# Patient Record
Sex: Male | Born: 1965 | Race: Black or African American | Hispanic: No | Marital: Married | Smoking: Former smoker
Health system: Southern US, Community
[De-identification: ages and names within clinical notes are randomized; demographics above are authoritative.]

## PROBLEM LIST (undated history)

## (undated) DIAGNOSIS — J449 Chronic obstructive pulmonary disease, unspecified: Secondary | ICD-10-CM

## (undated) DIAGNOSIS — G4730 Sleep apnea, unspecified: Secondary | ICD-10-CM

## (undated) DIAGNOSIS — Z8639 Personal history of other endocrine, nutritional and metabolic disease: Secondary | ICD-10-CM

## (undated) DIAGNOSIS — I10 Essential (primary) hypertension: Secondary | ICD-10-CM

## (undated) DIAGNOSIS — I509 Heart failure, unspecified: Secondary | ICD-10-CM

## (undated) DIAGNOSIS — E119 Type 2 diabetes mellitus without complications: Secondary | ICD-10-CM

## (undated) HISTORY — PX: OTHER SURGICAL HISTORY: SHX169

## (undated) HISTORY — PX: ANGIOPLASTY: SHX39

## (undated) HISTORY — PX: NASAL SINUS SURGERY: SHX719

---

## 2008-06-24 ENCOUNTER — Emergency Department: Payer: Self-pay | Admitting: Emergency Medicine

## 2009-07-23 ENCOUNTER — Encounter: Admission: RE | Admit: 2009-07-23 | Discharge: 2009-07-23 | Payer: Self-pay | Admitting: Cardiovascular Disease

## 2009-07-24 ENCOUNTER — Ambulatory Visit (HOSPITAL_COMMUNITY): Admission: RE | Admit: 2009-07-24 | Discharge: 2009-07-24 | Payer: Self-pay | Admitting: Cardiovascular Disease

## 2009-11-01 ENCOUNTER — Encounter: Admission: RE | Admit: 2009-11-01 | Discharge: 2009-11-01 | Payer: Self-pay | Admitting: Internal Medicine

## 2011-02-19 LAB — GLUCOSE, CAPILLARY: Glucose-Capillary: 132 mg/dL — ABNORMAL HIGH (ref 70–99)

## 2021-10-18 ENCOUNTER — Emergency Department (HOSPITAL_BASED_OUTPATIENT_CLINIC_OR_DEPARTMENT_OTHER): Payer: No Typology Code available for payment source

## 2021-10-18 ENCOUNTER — Other Ambulatory Visit: Payer: Self-pay

## 2021-10-18 ENCOUNTER — Encounter (HOSPITAL_BASED_OUTPATIENT_CLINIC_OR_DEPARTMENT_OTHER): Payer: Self-pay | Admitting: Emergency Medicine

## 2021-10-18 ENCOUNTER — Emergency Department (HOSPITAL_BASED_OUTPATIENT_CLINIC_OR_DEPARTMENT_OTHER)
Admission: EM | Admit: 2021-10-18 | Discharge: 2021-10-19 | Disposition: A | Discharge status: Home / Self Care | Payer: No Typology Code available for payment source | Attending: Emergency Medicine | Admitting: Emergency Medicine

## 2021-10-18 DIAGNOSIS — I509 Heart failure, unspecified: Secondary | ICD-10-CM | POA: Diagnosis not present

## 2021-10-18 DIAGNOSIS — E119 Type 2 diabetes mellitus without complications: Secondary | ICD-10-CM | POA: Insufficient documentation

## 2021-10-18 DIAGNOSIS — I110 Hypertensive heart disease with heart failure: Secondary | ICD-10-CM | POA: Diagnosis not present

## 2021-10-18 DIAGNOSIS — Z87891 Personal history of nicotine dependence: Secondary | ICD-10-CM | POA: Diagnosis not present

## 2021-10-18 DIAGNOSIS — J449 Chronic obstructive pulmonary disease, unspecified: Secondary | ICD-10-CM | POA: Diagnosis not present

## 2021-10-18 DIAGNOSIS — Z20822 Contact with and (suspected) exposure to covid-19: Secondary | ICD-10-CM | POA: Diagnosis not present

## 2021-10-18 DIAGNOSIS — R200 Anesthesia of skin: Secondary | ICD-10-CM | POA: Diagnosis present

## 2021-10-18 DIAGNOSIS — R69 Illness, unspecified: Secondary | ICD-10-CM

## 2021-10-18 HISTORY — DX: Heart failure, unspecified: I50.9

## 2021-10-18 HISTORY — DX: Essential (primary) hypertension: I10

## 2021-10-18 HISTORY — DX: Sleep apnea, unspecified: G47.30

## 2021-10-18 HISTORY — DX: Type 2 diabetes mellitus without complications: E11.9

## 2021-10-18 HISTORY — DX: Chronic obstructive pulmonary disease, unspecified: J44.9

## 2021-10-18 HISTORY — DX: Personal history of other endocrine, nutritional and metabolic disease: Z86.39

## 2021-10-18 LAB — APTT: aPTT: 31 seconds (ref 24–36)

## 2021-10-18 LAB — CBC
HCT: 36.7 % — ABNORMAL LOW (ref 39.0–52.0)
Hemoglobin: 11.8 g/dL — ABNORMAL LOW (ref 13.0–17.0)
MCH: 27.3 pg (ref 26.0–34.0)
MCHC: 32.2 g/dL (ref 30.0–36.0)
MCV: 84.8 fL (ref 80.0–100.0)
Platelets: 234 10*3/uL (ref 150–400)
RBC: 4.33 MIL/uL (ref 4.22–5.81)
RDW: 14.6 % (ref 11.5–15.5)
WBC: 13.7 10*3/uL — ABNORMAL HIGH (ref 4.0–10.5)
nRBC: 0 % (ref 0.0–0.2)

## 2021-10-18 LAB — URINALYSIS, ROUTINE W REFLEX MICROSCOPIC
Bilirubin Urine: NEGATIVE
Glucose, UA: NEGATIVE mg/dL
Hgb urine dipstick: NEGATIVE
Ketones, ur: NEGATIVE mg/dL
Leukocytes,Ua: NEGATIVE
Nitrite: NEGATIVE
Specific Gravity, Urine: 1.024 (ref 1.005–1.030)
pH: 6 (ref 5.0–8.0)

## 2021-10-18 LAB — COMPREHENSIVE METABOLIC PANEL
ALT: 15 U/L (ref 0–44)
AST: 12 U/L — ABNORMAL LOW (ref 15–41)
Albumin: 4 g/dL (ref 3.5–5.0)
Alkaline Phosphatase: 106 U/L (ref 38–126)
Anion gap: 8 (ref 5–15)
BUN: 18 mg/dL (ref 6–20)
CO2: 27 mmol/L (ref 22–32)
Calcium: 9.3 mg/dL (ref 8.9–10.3)
Chloride: 104 mmol/L (ref 98–111)
Creatinine, Ser: 1.13 mg/dL (ref 0.61–1.24)
GFR, Estimated: >60 mL/min (ref >60)
Glucose, Bld: 106 mg/dL — ABNORMAL HIGH (ref 70–99)
Potassium: 3.8 mmol/L (ref 3.5–5.1)
Sodium: 139 mmol/L (ref 135–145)
Total Bilirubin: 0.4 mg/dL (ref 0.3–1.2)
Total Protein: 6.8 g/dL (ref 6.5–8.1)

## 2021-10-18 LAB — RAPID URINE DRUG SCREEN, HOSP PERFORMED
Amphetamines: NOT DETECTED
Barbiturates: NOT DETECTED
Benzodiazepines: NOT DETECTED
Cocaine: NOT DETECTED
Opiates: NOT DETECTED
Tetrahydrocannabinol: NOT DETECTED

## 2021-10-18 LAB — PROTIME-INR
INR: 1 (ref 0.8–1.2)
Prothrombin Time: 13.2 seconds (ref 11.4–15.2)

## 2021-10-18 LAB — DIFFERENTIAL
Abs Immature Granulocytes: 0.05 10*3/uL (ref 0.00–0.07)
Basophils Absolute: 0.1 10*3/uL (ref 0.0–0.1)
Basophils Relative: 0 %
Eosinophils Absolute: 0.1 10*3/uL (ref 0.0–0.5)
Eosinophils Relative: 1 %
Immature Granulocytes: 0 %
Lymphocytes Relative: 11 %
Lymphs Abs: 1.5 10*3/uL (ref 0.7–4.0)
Monocytes Absolute: 1.1 10*3/uL — ABNORMAL HIGH (ref 0.1–1.0)
Monocytes Relative: 8 %
Neutro Abs: 10.9 10*3/uL — ABNORMAL HIGH (ref 1.7–7.7)
Neutrophils Relative %: 80 %

## 2021-10-18 MED ORDER — IOHEXOL 350 MG/ML SOLN
100.0000 mL | Freq: Once | INTRAVENOUS | Status: AC | PRN
  Administered 2021-10-18: 100 mL via INTRAVENOUS

## 2021-10-18 NOTE — ED Provider Notes (Signed)
[facility] EMERGENCY DEPT Provider Note   CSN: [id number] Arrival date & time: 10/18/21  1718     History Chief Complaint  Patient presents with   left arm numbness    Carlos Sanders is a 55 y.o. male.  HPI Patient reports he felt numbness in his left arm and leg at about 2 PM today.  He was watching TV at the time.  He denies getting dysfunction of the extremities.  He denies that they were weak or that he was unable to walk due to the symptoms.  He denies any associated visual changes confusion or headache.  He reports that he wanted to use a blood pressure machine because he was concerned about possibly elevated blood pressure and became very anxious.  He reports that perception of numbness seem to wax and wane over about the next hour.  He reports since coming to the emergency department for evaluation is gone now.  Patient reports he had a similar episode with all the same symptoms about a year ago.  He reports at that time he had carotid ultrasound done and was not thought to have had any stroke.  No MRIs were done or subsequent follow-up with neurology.  Patient reports that since that initial work-up he does intermittently get a perception of left upper extremity numbness or heaviness.  Sometimes there is left leg symptom associated.  This has come and gone sporadically.  Last episode was about 2 weeks ago.  He reports when this occurs, he does not get any other associated symptoms and he can use the extremities without limitation.  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  He reports he came today because it seemed to last longer than usual.    Past Medical History:  Diagnosis Date   CHF (congestive heart failure) (HCC)    COPD (chronic obstructive pulmonary disease) (HCC)    Diabetes mellitus without complication (HCC)    History of high cholesterol    Hypertension    Sleep apnea     There are no problems to  display for this patient.   Past Surgical History:  Procedure Laterality Date   ANGIOPLASTY     left shoulder surgery     NASAL SINUS SURGERY         No family history on file.  Social History   Tobacco Use   Smoking status: Former    Types: Cigarettes   Smokeless tobacco: Never  Substance Use Topics   Alcohol use: Never   Drug use: Never    Home Medications Prior to Admission medications   Not on File    Allergies    Patient has no known allergies.  Review of Systems   Review of Systems 10 systems reviewed negative except as per HPI Physical Exam Updated Vital Signs BP 127/72   Pulse 72   Temp 98.3 F (36.8 C) (Oral)   Resp (!) 22   Ht  (1.905 m)   Wt (!) 170 kg   SpO2 98%   BMI 46.84 kg/m   Physical Exam Constitutional:      Appearance: Normal appearance.  HENT:     Nose: Nose normal.     Mouth/Throat:     Mouth: Mucous membranes are moist.     Pharynx: Oropharynx is clear.  Eyes:     Extraocular Movements: Extraocular movements intact.     Conjunctiva/sclera: Conjunctivae normal.     Pupils: Pupils  are equal, round, and reactive to light.  Cardiovascular:     Rate and Rhythm: Normal rate and regular rhythm.  Pulmonary:     Effort: Pulmonary effort is normal.     Breath sounds: Normal breath sounds.  Abdominal:     General: There is no distension.     Palpations: Abdomen is soft.     Tenderness: There is no abdominal tenderness. There is no guarding.  Musculoskeletal:        General: Normal range of motion.  Skin:    General: Skin is warm and dry.  Neurological:     General: No focal deficit present.     Mental Status: He is alert and oriented to person, place, and time.     Cranial Nerves: No cranial nerve deficit.     Sensory: No sensory deficit.     Motor: No weakness.     Coordination: Coordination normal.     Gait: Gait normal.     Comments: Cognitive function normal.  No expressive or receptive aphasia.  Motor strength  upper and lower 5\5.  Patient can stand and ambulate with coordinated gait.  No subjective decreased to light touch x4 extremities  Psychiatric:        Mood and Affect: Mood normal.    ED Results / Procedures / Treatments   Labs (all labs ordered are listed, but only abnormal results are displayed) Labs Reviewed  CBC - Abnormal; Notable for the following components:      Result Value   WBC 13.7 (*)    Hemoglobin 11.8 (*)    HCT 36.7 (*)    All other components within normal limits  DIFFERENTIAL - Abnormal; Notable for the following components:   Neutro Abs 10.9 (*)    Monocytes Absolute 1.1 (*)    All other components within normal limits  COMPREHENSIVE METABOLIC PANEL - Abnormal; Notable for the following components:   Glucose, Bld 106 (*)    AST 12 (*)    All other components within normal limits  URINALYSIS, ROUTINE W REFLEX MICROSCOPIC - Abnormal; Notable for the following components:   Protein, ur TRACE (*)    All other components within normal limits  RESP PANEL BY RT-PCR (FLU A&B, COVID) [id number]  PROTIME-INR  APTT  RAPID URINE DRUG SCREEN, HOSP PERFORMED    EKG EKG Interpretation  Date/Time:  Sunday October 18 2021 17:50:40 EST Ventricular Rate:  93 PR Interval:  170 QRS Duration: 90 QT Interval:  362 QTC Calculation: 450 R Axis:   1 Text Interpretation: Sinus rhythm with occasional Premature ventricular complexes Otherwise normal ECG agree, no old comparison Confirmed by Arby Barrette [phone]) on 10/18/2021 8:31:57 PM  Radiology CT ANGIO HEAD NECK W WO CM  Result Date: 10/19/2021 CLINICAL DATA:  Intermittent right arm numbness EXAM: CT ANGIOGRAPHY HEAD AND NECK TECHNIQUE: Multidetector CT imaging of the head and neck was performed using the standard protocol during bolus administration of intravenous contrast. Multiplanar CT image reconstructions and MIPs were obtained to evaluate the vascular anatomy. Carotid stenosis measurements (when applicable) are obtained  utilizing NASCET criteria, using the distal internal carotid diameter as the denominator. CONTRAST:  OMNIPAQUE IOHEXOL 350 MG/ML SOLN COMPARISON:  None. FINDINGS: CTA NECK FINDINGS SKELETON: There is no bony spinal canal stenosis. No lytic or blastic lesion. OTHER NECK: Normal pharynx, larynx and major salivary glands. No cervical lymphadenopathy. Unremarkable thyroid gland. UPPER CHEST: No pneumothorax or pleural effusion. No nodules or masses. AORTIC ARCH: There is no calcific  atherosclerosis of the aortic arch. There is no aneurysm, dissection or hemodynamically significant stenosis of the visualized portion of the aorta. Conventional 3 vessel aortic branching pattern. The visualized proximal subclavian arteries are widely patent. RIGHT CAROTID SYSTEM: Normal without aneurysm, dissection or stenosis. LEFT CAROTID SYSTEM: Normal without aneurysm, dissection or stenosis. VERTEBRAL ARTERIES: Left dominant configuration. Both origins are clearly patent. There is no dissection, occlusion or flow-limiting stenosis to the skull base (V1-V3 segments). CTA HEAD FINDINGS POSTERIOR CIRCULATION: --Vertebral arteries: Normal V4 segments. --Inferior cerebellar arteries: Normal. --Basilar artery: Normal. --Superior cerebellar arteries: Normal. --Posterior cerebral arteries (PCA): Normal. ANTERIOR CIRCULATION: --Intracranial internal carotid arteries: Normal. --Anterior cerebral arteries (ACA): Normal. Both A1 segments are present. Patent anterior communicating artery (a-comm). --Middle cerebral arteries (MCA): Normal. VENOUS SINUSES: As permitted by contrast timing, patent. ANATOMIC VARIANTS: None Review of the MIP images confirms the above findings. IMPRESSION: Normal CTA of the head and neck. Electronically Signed   By: Deatra Kitts M.D.   On: 10/19/2021 00:07   CT HEAD WO CONTRAST  Result Date: 10/18/2021 CLINICAL DATA:  Intermittent upper extremity numbness. EXAM: CT HEAD WITHOUT CONTRAST TECHNIQUE: Contiguous  axial images were obtained from the base of the skull through the vertex without intravenous contrast. COMPARISON:  None. FINDINGS: Brain: No evidence of acute infarction, hemorrhage, hydrocephalus, extra-axial collection or mass lesion/mass effect. Vascular: There are scattered calcifications of the carotid siphons but no hyperdense central vessels. Skull: Normal. Negative for fracture or focal lesion. Sinuses/Orbits: No acute finding. There is bilateral proptosis without extraocular muscle thickening, mildly proliferated intraorbital fat. Other: Coarse benign dural calcification in the frontal falx. IMPRESSION: 1. No acute intracranial CT findings. 2. Carotid calcific atherosclerosis. 3. Proptosis with proliferated intraorbital fat. No extraocular muscle thickening. Electronically Signed   By: Almira Bar M.D.   On: 10/18/2021 21:29    Procedures Procedures   Medications Ordered in ED Medications  iohexol (OMNIPAQUE) 350 MG/ML injection 100 mL (100 mLs Intravenous Contrast Given 10/18/21 2344)    ED Course  I have reviewed the triage vital signs and the nursing notes.  Pertinent labs & imaging results that were available during my care of the patient were reviewed by me and considered in my medical decision making (see chart for details).    MDM Rules/Calculators/A&P                           Patient presented as outlined with sporadic left upper extremity and sometimes left lower extremity numbness or heaviness.  No associated motor dysfunction or other symptoms.  Patient reports work-up for stroke a year ago with similar symptoms.  He has had echo and carotid ultrasound that did not have any acute findings.  Patient does have significant stroke risk factors.  Due to change in severity and risk factors, stroke work-up initiated.  For CT does not show any acute findings and basic diagnostic work-up otherwise unremarkable.  Consult: Neurology Dr. Wilford Corner.  We reviewed the history of the  patient's symptoms and pattern.  Reviewed today's findings.  Recommendation is to get CT angiogram head and neck.  If no occlusive findings, patient appropriate for continued outpatient work-up with neurology.  Patient remains asymptomatic vital signs remain normal.  CT angio results pending.   CTA normal.  Recommendations to follow-up plan reviewed with the patient. Final Clinical Impression(s) / ED Diagnoses Final diagnoses:  Numbness of left lower extremity  Left upper extremity numbness  Severe comorbid illness  Rx / DC Orders ED Discharge Orders     None        Arby Barrette, MD 10/19/21 [phone]

## 2021-10-18 NOTE — ED Triage Notes (Signed)
Per EMS, pt c/o intermittent arm numbness, self-resolving, for past few weeks.  Each episode lasts approx 10 minutes.  Pt AAO x4.  Vitals en route: 150/76, hr 78, resp 18, 98% on 2L O2 (per pt norm), CBG 200.

## 2021-10-18 NOTE — ED Notes (Signed)
ED Provider at bedside. 

## 2021-10-18 NOTE — ED Notes (Signed)
Pt ambulatory to the bathroom. Will collect u/a.

## 2021-10-18 NOTE — ED Triage Notes (Signed)
Pt reports left arm heaviness and numbness for past few weeks, off and on.  Denies CP, SOB or other symptoms.  Pt states episode today seemed to last a bit longer than previous episodes, but has since resolved.

## 2021-10-19 LAB — RESP PANEL BY RT-PCR (FLU A&B, COVID) ARPGX2
Influenza A by PCR: NEGATIVE
Influenza B by PCR: NEGATIVE
SARS Coronavirus 2 by RT PCR: NEGATIVE

## 2021-10-19 NOTE — Discharge Instructions (Signed)
1.  Continue your daily aspirin 2.  Schedule a follow-up with neurology within the next week.  Call your family physician to schedule your follow-up appointment.  Alternatively you may call [facility].  A referral has been placed to you can schedule an appointment. 3.  Return to emergency department immediately if you have recurrence of symptoms with any weakness, dysfunction or any other associated symptoms such as visual changes confusion headache difficulty with speech.

## 2021-10-19 NOTE — ED Notes (Signed)
Pt was discharged @0020 

## 2023-08-27 IMAGING — CT CT HEAD W/O CM
4 series · 16 of 47 positions shown, 18 images · non-contrast
Comparison: None.

CLINICAL DATA: Intermittent upper extremity numbness.

EXAM:
CT HEAD WITHOUT CONTRAST
TECHNIQUE: Contiguous axial images were obtained from the base of the skull
through the vertex without intravenous contrast.

[Series 2: head wo · axial · 0.50mm/px · z∈[+875,+1010]mm · 7 of 37 slices shown, 9 images]
[im 5/37  brain]
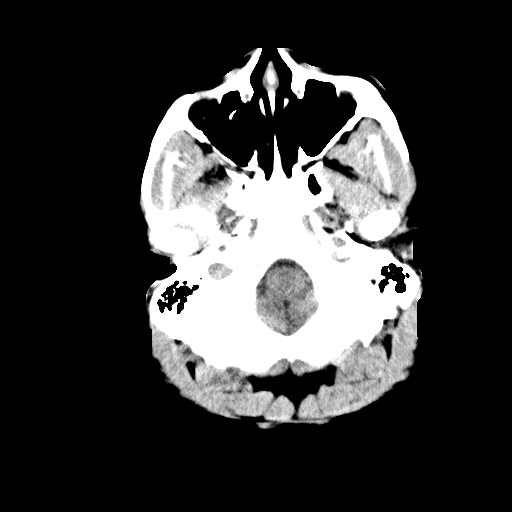
[im 5/37  bone]
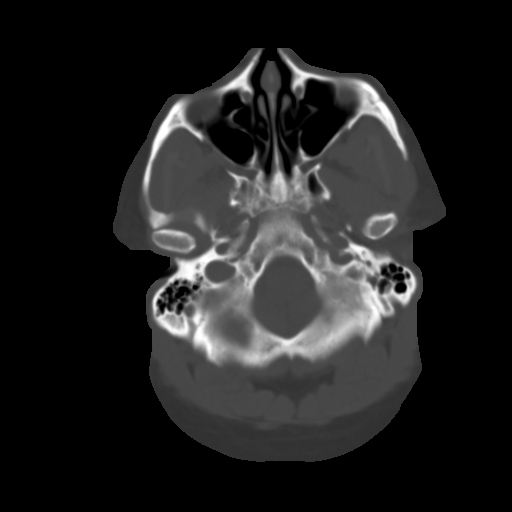
[im 10/37  brain]
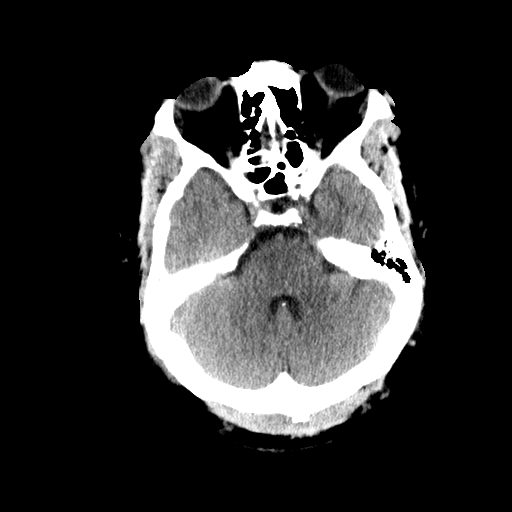
[im 14/37  brain]
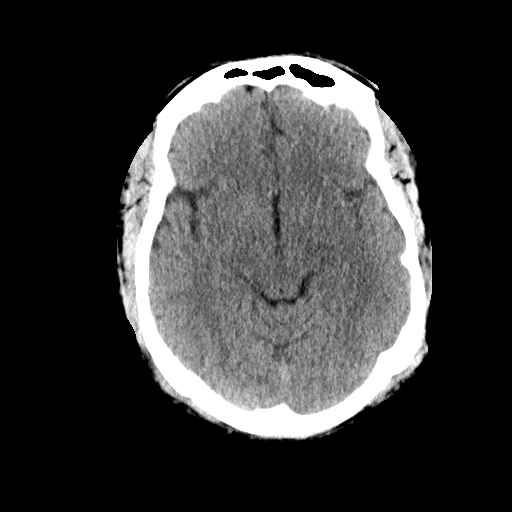
[im 19/37  brain]
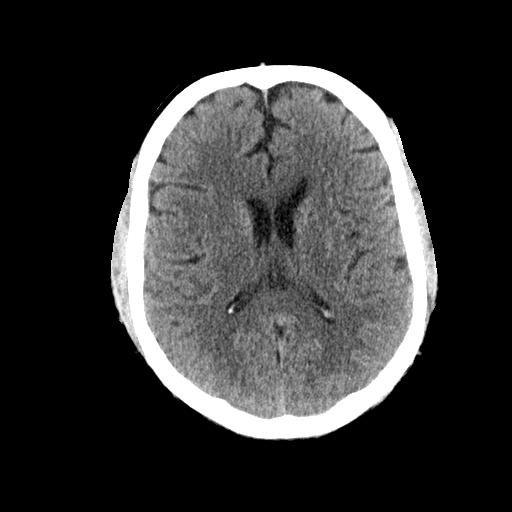
[im 23/37  brain]
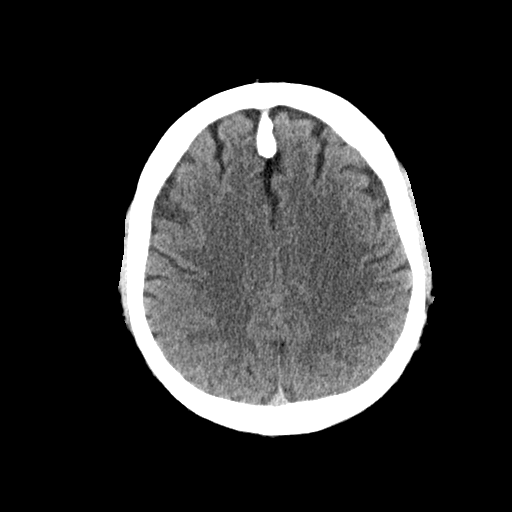
[im 23/37  bone]
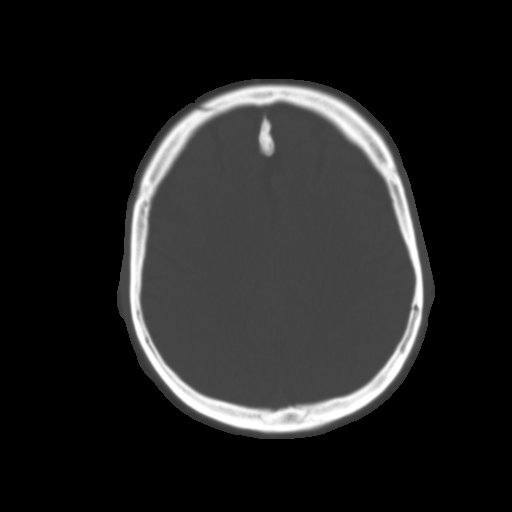
[im 28/37  brain]
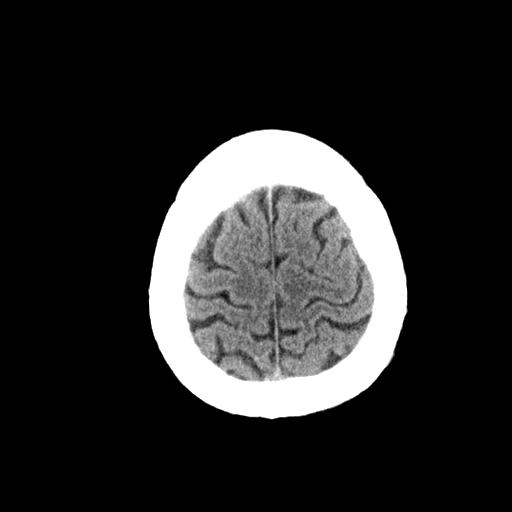
[im 32/37  brain]
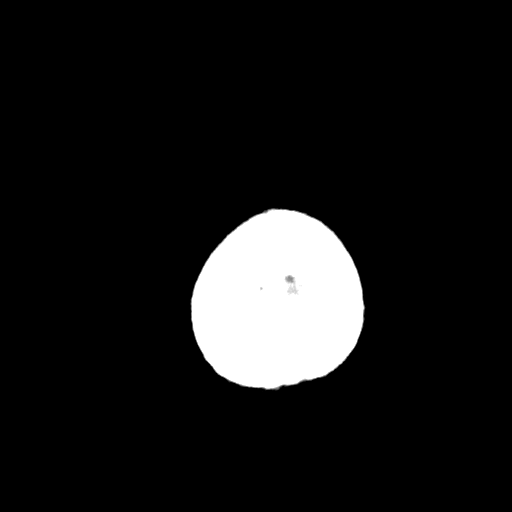

[Series 3: head bone · axial · 0.50mm/px · z∈[+873,+909]mm · 3 of 91 slices shown]
[im 10/91  bone]
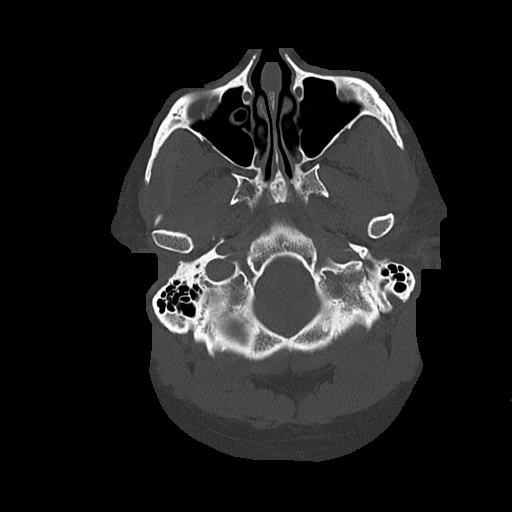
[im 19/91  bone]
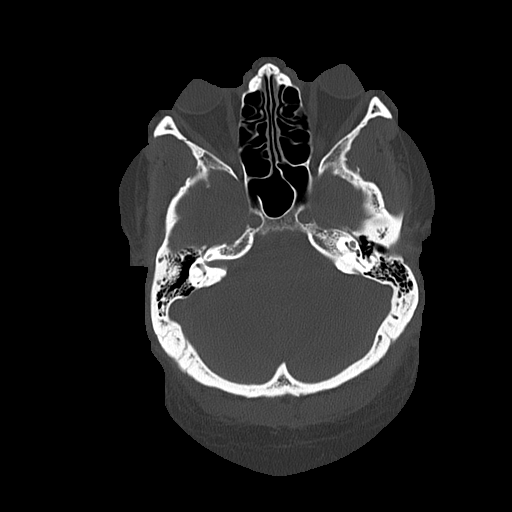
[im 28/91  bone]
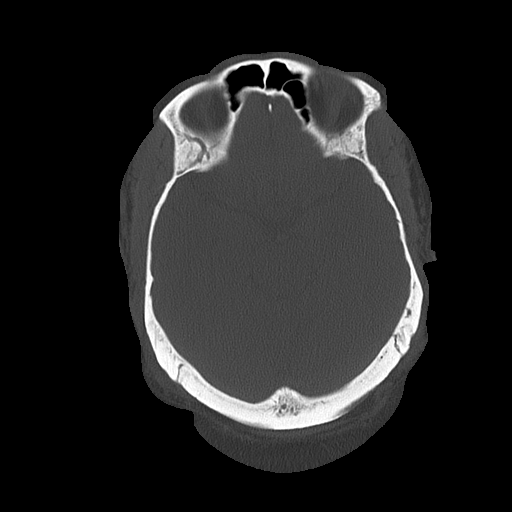

[Series 4: coronal soft · coronal · 0.37mm/px · 3 of 77 slices shown]
[im 26/77  brain]
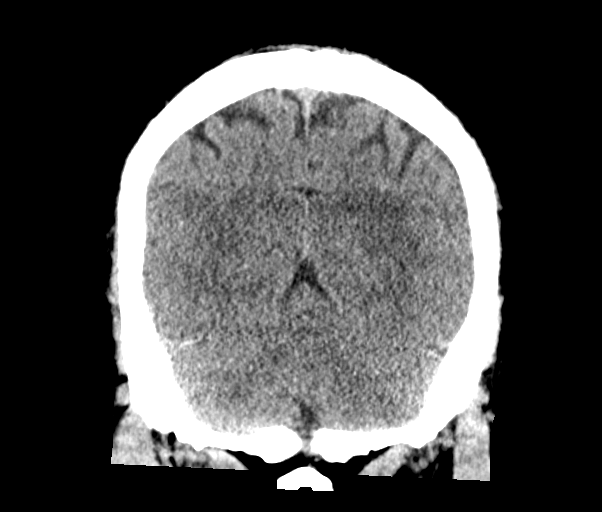
[im 34/77  brain]
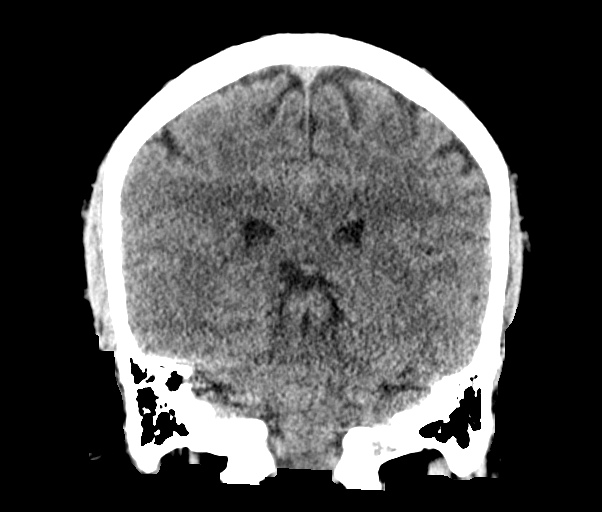
[im 43/77  brain]
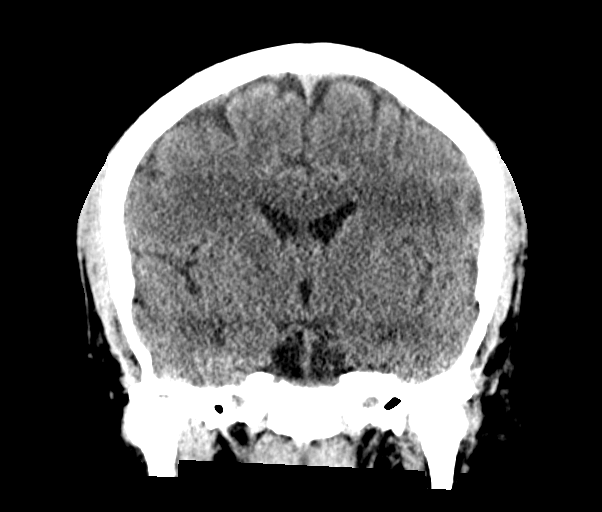

[Series 5: sagittal soft · sagittal · 0.39mm/px · 3 of 70 slices shown]
[im 24/70  brain]
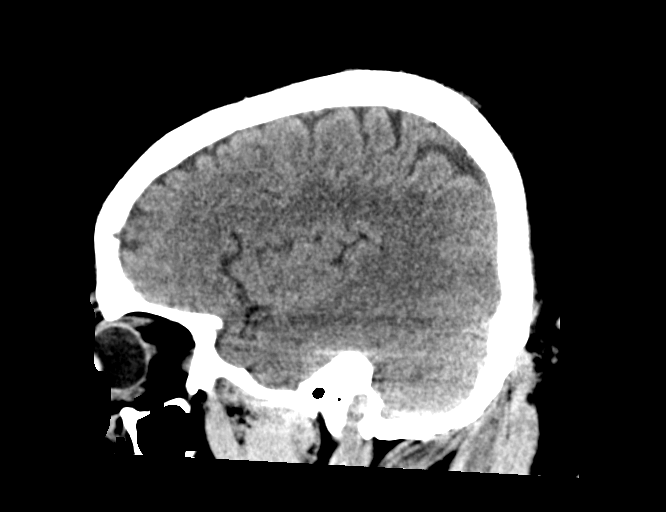
[im 35/70  brain]
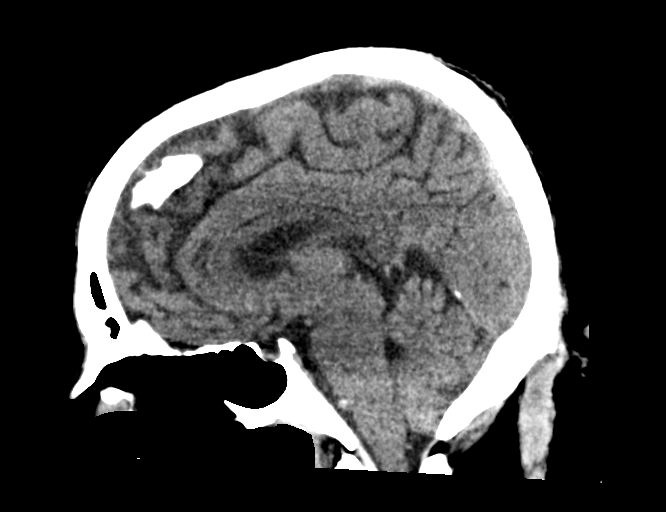
[im 47/70  brain]
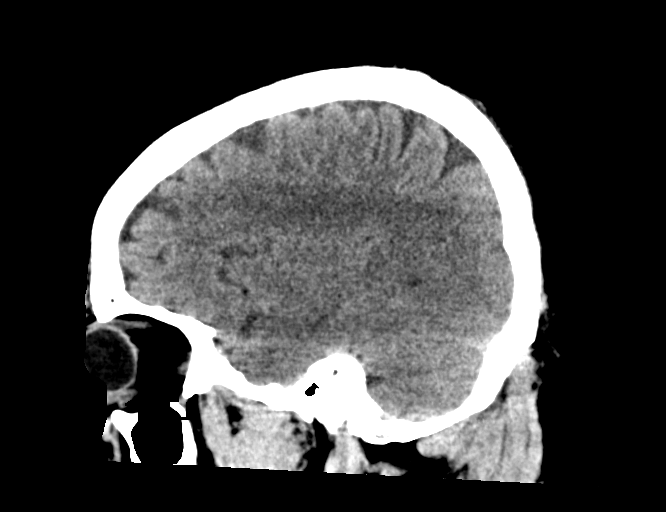

[16 of 47 positions shown; findings below may reference images not displayed]

FINDINGS: Brain: No evidence of acute infarction, hemorrhage, hydrocephalus,
extra-axial collection or mass lesion/mass effect.

Vascular: There are scattered calcifications of the carotid siphons
but no hyperdense central vessels.

Skull: Normal. Negative for fracture or focal lesion.

Sinuses/Orbits: No acute finding. There is bilateral proptosis
without extraocular muscle thickening, mildly proliferated
intraorbital fat.

Other: Coarse benign dural calcification in the frontal falx.
IMPRESSION: 1. No acute intracranial CT findings.
2. Carotid calcific atherosclerosis.
3. Proptosis with proliferated intraorbital fat. No extraocular
muscle thickening.

## 2023-11-15 ENCOUNTER — Other Ambulatory Visit: Payer: Self-pay

## 2023-11-15 ENCOUNTER — Encounter: Payer: Self-pay | Admitting: Emergency Medicine

## 2023-11-15 ENCOUNTER — Emergency Department: Payer: No Typology Code available for payment source

## 2023-11-15 ENCOUNTER — Inpatient Hospital Stay
Admission: EM | Admit: 2023-11-15 | Discharge: 2023-11-16 | DRG: 534 | Disposition: A | Discharge status: Other Acute Inpatient Hospital | Payer: No Typology Code available for payment source | Attending: Internal Medicine | Admitting: Internal Medicine

## 2023-11-15 DIAGNOSIS — E1122 Type 2 diabetes mellitus with diabetic chronic kidney disease: Secondary | ICD-10-CM | POA: Diagnosis present

## 2023-11-15 DIAGNOSIS — Z87891 Personal history of nicotine dependence: Secondary | ICD-10-CM

## 2023-11-15 DIAGNOSIS — N189 Chronic kidney disease, unspecified: Secondary | ICD-10-CM | POA: Diagnosis present

## 2023-11-15 DIAGNOSIS — J449 Chronic obstructive pulmonary disease, unspecified: Secondary | ICD-10-CM | POA: Diagnosis present

## 2023-11-15 DIAGNOSIS — Y92002 Bathroom of unspecified non-institutional (private) residence single-family (private) house as the place of occurrence of the external cause: Secondary | ICD-10-CM | POA: Diagnosis not present

## 2023-11-15 DIAGNOSIS — E785 Hyperlipidemia, unspecified: Secondary | ICD-10-CM | POA: Insufficient documentation

## 2023-11-15 DIAGNOSIS — I509 Heart failure, unspecified: Secondary | ICD-10-CM | POA: Diagnosis present

## 2023-11-15 DIAGNOSIS — Z794 Long term (current) use of insulin: Secondary | ICD-10-CM

## 2023-11-15 DIAGNOSIS — Z6841 Body Mass Index (BMI) 40.0 and over, adult: Secondary | ICD-10-CM | POA: Diagnosis not present

## 2023-11-15 DIAGNOSIS — E1165 Type 2 diabetes mellitus with hyperglycemia: Secondary | ICD-10-CM | POA: Diagnosis not present

## 2023-11-15 DIAGNOSIS — N179 Acute kidney failure, unspecified: Secondary | ICD-10-CM

## 2023-11-15 DIAGNOSIS — K219 Gastro-esophageal reflux disease without esophagitis: Secondary | ICD-10-CM | POA: Diagnosis present

## 2023-11-15 DIAGNOSIS — I130 Hypertensive heart and chronic kidney disease with heart failure and stage 1 through stage 4 chronic kidney disease, or unspecified chronic kidney disease: Secondary | ICD-10-CM | POA: Diagnosis present

## 2023-11-15 DIAGNOSIS — C642 Malignant neoplasm of left kidney, except renal pelvis: Secondary | ICD-10-CM | POA: Diagnosis present

## 2023-11-15 DIAGNOSIS — E7800 Pure hypercholesterolemia, unspecified: Secondary | ICD-10-CM | POA: Diagnosis present

## 2023-11-15 DIAGNOSIS — X509XXA Other and unspecified overexertion or strenuous movements or postures, initial encounter: Secondary | ICD-10-CM | POA: Diagnosis not present

## 2023-11-15 DIAGNOSIS — S7292XA Unspecified fracture of left femur, initial encounter for closed fracture: Principal | ICD-10-CM

## 2023-11-15 DIAGNOSIS — S728X2A Other fracture of left femur, initial encounter for closed fracture: Secondary | ICD-10-CM | POA: Diagnosis present

## 2023-11-15 DIAGNOSIS — M25562 Pain in left knee: Secondary | ICD-10-CM | POA: Diagnosis present

## 2023-11-15 DIAGNOSIS — E119 Type 2 diabetes mellitus without complications: Secondary | ICD-10-CM

## 2023-11-15 DIAGNOSIS — I10 Essential (primary) hypertension: Secondary | ICD-10-CM

## 2023-11-15 DIAGNOSIS — D509 Iron deficiency anemia, unspecified: Secondary | ICD-10-CM | POA: Diagnosis present

## 2023-11-15 DIAGNOSIS — Z7984 Long term (current) use of oral hypoglycemic drugs: Secondary | ICD-10-CM | POA: Diagnosis not present

## 2023-11-15 DIAGNOSIS — G4733 Obstructive sleep apnea (adult) (pediatric): Secondary | ICD-10-CM | POA: Insufficient documentation

## 2023-11-15 DIAGNOSIS — Z79899 Other long term (current) drug therapy: Secondary | ICD-10-CM | POA: Diagnosis not present

## 2023-11-15 DIAGNOSIS — D649 Anemia, unspecified: Secondary | ICD-10-CM

## 2023-11-15 DIAGNOSIS — S72402A Unspecified fracture of lower end of left femur, initial encounter for closed fracture: Secondary | ICD-10-CM | POA: Diagnosis present

## 2023-11-15 LAB — TYPE AND SCREEN
ABO/RH(D): O POS
Antibody Screen: NEGATIVE

## 2023-11-15 LAB — CBC
HCT: 25.1 % — ABNORMAL LOW (ref 39.0–52.0)
Hemoglobin: 8.6 g/dL — ABNORMAL LOW (ref 13.0–17.0)
MCH: 33.5 pg (ref 26.0–34.0)
MCHC: 34.3 g/dL (ref 30.0–36.0)
MCV: 97.7 fL (ref 80.0–100.0)
Platelets: 158 10*3/uL (ref 150–400)
RBC: 2.57 MIL/uL — ABNORMAL LOW (ref 4.22–5.81)
RDW: 13.9 % (ref 11.5–15.5)
WBC: 10 10*3/uL (ref 4.0–10.5)
nRBC: 0 % (ref 0.0–0.2)

## 2023-11-15 LAB — COMPREHENSIVE METABOLIC PANEL
ALT: 54 U/L — ABNORMAL HIGH (ref 0–44)
AST: 26 U/L (ref 15–41)
Albumin: 3.6 g/dL (ref 3.5–5.0)
Alkaline Phosphatase: 93 U/L (ref 38–126)
Anion gap: 12 (ref 5–15)
BUN: 45 mg/dL — ABNORMAL HIGH (ref 6–20)
CO2: 23 mmol/L (ref 22–32)
Calcium: 10.2 mg/dL (ref 8.9–10.3)
Chloride: 102 mmol/L (ref 98–111)
Creatinine, Ser: 2.49 mg/dL — ABNORMAL HIGH (ref 0.61–1.24)
GFR, Estimated: 29 mL/min — ABNORMAL LOW (ref >60)
Glucose, Bld: 201 mg/dL — ABNORMAL HIGH (ref 70–99)
Potassium: 4 mmol/L (ref 3.5–5.1)
Sodium: 137 mmol/L (ref 135–145)
Total Bilirubin: 0.5 mg/dL (ref 0.0–1.2)
Total Protein: 6.7 g/dL (ref 6.5–8.1)

## 2023-11-15 LAB — PROTIME-INR
INR: 1.1 (ref 0.8–1.2)
Prothrombin Time: 14.3 s (ref 11.4–15.2)

## 2023-11-15 LAB — APTT: aPTT: 32 s (ref 24–36)

## 2023-11-15 MED ORDER — PANTOPRAZOLE SODIUM 40 MG PO TBEC
40.0000 mg | DELAYED_RELEASE_TABLET | Freq: Every day | ORAL | Status: DC
  Administered 2023-11-16: 40 mg via ORAL
  Filled 2023-11-15 (×2): qty 1

## 2023-11-15 MED ORDER — FAMOTIDINE 20 MG PO TABS
20.0000 mg | ORAL_TABLET | Freq: Every day | ORAL | Status: DC
  Administered 2023-11-16: 20 mg via ORAL
  Filled 2023-11-15 (×2): qty 1

## 2023-11-15 MED ORDER — LOSARTAN POTASSIUM 50 MG PO TABS
100.0000 mg | ORAL_TABLET | Freq: Every day | ORAL | Status: DC
  Filled 2023-11-15: qty 2

## 2023-11-15 MED ORDER — ACETAMINOPHEN 325 MG PO TABS
650.0000 mg | ORAL_TABLET | Freq: Four times a day (QID) | ORAL | Status: DC | PRN
  Administered 2023-11-16: 650 mg via ORAL
  Filled 2023-11-15: qty 2

## 2023-11-15 MED ORDER — FENTANYL CITRATE PF 50 MCG/ML IJ SOSY
50.0000 ug | PREFILLED_SYRINGE | INTRAMUSCULAR | Status: AC | PRN
  Administered 2023-11-15 (×2): 50 ug via INTRAVENOUS
  Filled 2023-11-15 (×2): qty 1

## 2023-11-15 MED ORDER — MAGNESIUM HYDROXIDE 400 MG/5ML PO SUSP: 30.0000 mL | Freq: Every day | ORAL | Status: DC | PRN

## 2023-11-15 MED ORDER — INSULIN ASPART 100 UNIT/ML IJ SOLN: 15.0000 [IU] | Freq: Three times a day (TID) | INTRAMUSCULAR | Status: DC

## 2023-11-15 MED ORDER — SEMAGLUTIDE 7 MG PO TABS: 7.0000 mg | ORAL_TABLET | Freq: Every day | ORAL | Status: DC

## 2023-11-15 MED ORDER — ONDANSETRON HCL 4 MG PO TABS
4.0000 mg | ORAL_TABLET | Freq: Four times a day (QID) | ORAL | Status: DC | PRN
Start: 2023-11-15 — End: 2023-11-16

## 2023-11-15 MED ORDER — TRAZODONE HCL 50 MG PO TABS: 50.0000 mg | ORAL_TABLET | Freq: Every evening | ORAL | Status: DC | PRN

## 2023-11-15 MED ORDER — ATORVASTATIN CALCIUM 80 MG PO TABS
80.0000 mg | ORAL_TABLET | Freq: Every day | ORAL | Status: DC
  Administered 2023-11-15 – 2023-11-16 (×2): 80 mg via ORAL
  Filled 2023-11-15: qty 1
  Filled 2023-11-15: qty 4

## 2023-11-15 MED ORDER — ONDANSETRON HCL 4 MG/2ML IJ SOLN: 4.0000 mg | Freq: Four times a day (QID) | INTRAMUSCULAR | Status: DC | PRN

## 2023-11-15 MED ORDER — SODIUM CHLORIDE 0.9 % IV SOLN: INTRAVENOUS | Status: DC

## 2023-11-15 MED ORDER — METOPROLOL TARTRATE 50 MG PO TABS
50.0000 mg | ORAL_TABLET | Freq: Two times a day (BID) | ORAL | Status: DC
  Administered 2023-11-15 – 2023-11-16 (×2): 50 mg via ORAL
  Filled 2023-11-15 (×2): qty 1

## 2023-11-15 MED ORDER — METFORMIN HCL ER 500 MG PO TB24
500.0000 mg | ORAL_TABLET | Freq: Every day | ORAL | Status: DC
  Filled 2023-11-15: qty 1

## 2023-11-15 MED ORDER — AMLODIPINE BESYLATE 10 MG PO TABS
10.0000 mg | ORAL_TABLET | Freq: Every day | ORAL | Status: DC
  Administered 2023-11-15 – 2023-11-16 (×2): 10 mg via ORAL
  Filled 2023-11-15: qty 1
  Filled 2023-11-15: qty 2

## 2023-11-15 MED ORDER — MORPHINE SULFATE (PF) 2 MG/ML IV SOLN
2.0000 mg | INTRAVENOUS | Status: DC | PRN
  Administered 2023-11-16 (×3): 2 mg via INTRAVENOUS
  Filled 2023-11-15 (×3): qty 1

## 2023-11-15 MED ORDER — FENTANYL CITRATE PF 50 MCG/ML IJ SOSY
50.0000 ug | PREFILLED_SYRINGE | Freq: Once | INTRAMUSCULAR | Status: AC
  Administered 2023-11-15: 50 ug via INTRAVENOUS
  Filled 2023-11-15: qty 1

## 2023-11-15 MED ORDER — ENOXAPARIN SODIUM 80 MG/0.8ML IJ SOSY
0.5000 mg/kg | PREFILLED_SYRINGE | INTRAMUSCULAR | Status: DC
  Filled 2023-11-15: qty 0.82

## 2023-11-15 MED ORDER — SPIRONOLACTONE 25 MG PO TABS
25.0000 mg | ORAL_TABLET | Freq: Every day | ORAL | Status: DC
  Filled 2023-11-15: qty 1

## 2023-11-15 MED ORDER — ACETAMINOPHEN 650 MG RE SUPP: 650.0000 mg | Freq: Four times a day (QID) | RECTAL | Status: DC | PRN

## 2023-11-15 MED ORDER — FUROSEMIDE 40 MG PO TABS
20.0000 mg | ORAL_TABLET | Freq: Every day | ORAL | Status: DC
  Filled 2023-11-15: qty 1

## 2023-11-15 MED ORDER — FENTANYL CITRATE PF 50 MCG/ML IJ SOSY
100.0000 ug | PREFILLED_SYRINGE | Freq: Once | INTRAMUSCULAR | Status: AC
  Administered 2023-11-15: 100 ug via INTRAVENOUS
  Filled 2023-11-15: qty 2

## 2023-11-15 MED ORDER — EMPAGLIFLOZIN 10 MG PO TABS
10.0000 mg | ORAL_TABLET | Freq: Every day | ORAL | Status: DC
  Administered 2023-11-16: 10 mg via ORAL
  Filled 2023-11-15: qty 1

## 2023-11-15 NOTE — ED Notes (Signed)
 I called [facility] for an update on the transfer spoke with rep. Josh , rep stated they are at capacity at this time an can not accept a transfer.

## 2023-11-15 NOTE — ED Provider Triage Note (Signed)
 Emergency Medicine Provider Triage Evaluation Note  Carlos Sanders , a 57 y.o. male  was evaluated in triage.  Pt complains of left femur pain, went to sit felt a pop, states has a possible cancerous lesion on his kidney and had a spot on his femur.  Review of Systems  Positive:  Negative:   Physical Exam  There were no vitals taken for this visit. Gen:   Awake, no distress   Resp:  Normal effort  MSK:   Moves extremities without difficulty  Other:    Medical Decision Making  Medically screening exam initiated at 5:07 PM.  Appropriate orders placed.  Tywan Siever was informed that the remainder of the evaluation will be completed by another provider, this initial triage assessment does not replace that evaluation, and the importance of remaining in the ED until their evaluation is complete.     Gasper Devere ORN, PA-C 11/15/23 1708

## 2023-11-15 NOTE — ED Notes (Signed)
 [facility] spoke with rep. Ephriam Knuckles gave him brief info about the pt. faxed demo sheet and all images are power shared. Rep is paging Ortho and will call back.

## 2023-11-15 NOTE — ED Notes (Signed)
[facility] transfer called for potential transfer.  Facesheet faxed to [facility]  Awaiting call back

## 2023-11-15 NOTE — ED Notes (Signed)
 Per Dr. Cyril Loosen pt. is admitted to [facility], but he is on the waiting list, pt will be admitted here until transfer is complete. I called [facility] spoke with rep Casimiro Needle and canceled transfer that was in progress.

## 2023-11-15 NOTE — ED Notes (Signed)
 [facility] called spoke with rep Marchelle Folks, she said after an administrative review the Wellsite geologist declined the request due to capacity.

## 2023-11-15 NOTE — ED Provider Notes (Signed)
 Carlos Sanders Provider Note    Event Date/Time   First MD Initiated Contact with Patient 11/15/23 1806     (approximate)   History   Knee Injury   HPI  Carlos Sanders is a 57 y.o. male who presents after an injury to his left leg.  Patient reports that he planted his foot and turned and felt a snap in his left knee.  He is unable to ambulate     Physical Exam   Triage Vital Signs: ED Triage Vitals  Encounter Vitals Group     BP 11/15/23 1707 (!) 145/68     Systolic BP Percentile --      Diastolic BP Percentile --      Pulse Rate 11/15/23 1707 93     Resp 11/15/23 1707 20     Temp 11/15/23 1707 97.9 F (36.6 C)     Temp Source 11/15/23 1707 Oral     SpO2 11/15/23 1707 96 %     Weight 11/15/23 1710 (!) 164.7 kg (363 lb)     Height 11/15/23 1710 1.905 m (6' 3)     Head Circumference --      Peak Flow --      Pain Score 11/15/23 1710 5     Pain Loc --      Pain Education --      Exclude from Growth Chart --     Most recent vital signs: Vitals:   11/15/23 1707 11/15/23 2235  BP: (!) 145/68 113/61  Pulse: 93 (!) 109  Resp: 20 20  Temp: 97.9 F (36.6 C) 98.9 F (37.2 C)  SpO2: 96% 92%     General: Awake, no distress.  CV:  Good peripheral perfusion.  Resp:  Normal effort.  Abd:  No distention.  Other:  Left leg, good pulses distally, femur splinted   ED Results / Procedures / Treatments   Labs (all labs ordered are listed, but only abnormal results are displayed) Labs Reviewed  CBC - Abnormal; Notable for the following components:      Result Value   RBC 2.57 (*)    Hemoglobin 8.6 (*)    HCT 25.1 (*)    All other components within normal limits  COMPREHENSIVE METABOLIC PANEL - Abnormal; Notable for the following components:   Glucose, Bld 201 (*)    BUN 45 (*)    Creatinine, Ser 2.49 (*)    ALT 54 (*)    GFR, Estimated 29 (*)    All other components within normal limits  APTT  PROTIME-INR  TYPE AND SCREEN      EKG     RADIOLOGY Femur x-ray viewed interpret by me, spiral midshaft fracture, likely pathologic lesion noted    PROCEDURES:  Critical Care performed:   Procedures   MEDICATIONS ORDERED IN ED: Medications  amLODipine  (NORVASC ) tablet 10 mg (10 mg Oral Given 11/15/23 2238)  atorvastatin  (LIPITOR ) tablet 80 mg (80 mg Oral Given 11/15/23 2238)  famotidine  (PEPCID ) tablet 20 mg (20 mg Oral Patient Refused/Not Given 11/15/23 2239)  furosemide  (LASIX ) tablet 20 mg (20 mg Oral Patient Refused/Not Given 11/15/23 2239)  insulin  aspart (novoLOG ) injection 15 Units (has no administration in time range)  empagliflozin  (JARDIANCE ) tablet 10 mg (has no administration in time range)  losartan  (COZAAR ) tablet 100 mg (100 mg Oral Patient Refused/Not Given 11/15/23 2240)  metFORMIN  (GLUCOPHAGE -XR) 24 hr tablet 500 mg (has no administration in time range)  metoprolol  tartrate (LOPRESSOR ) tablet 50 mg (50  mg Oral Given 11/15/23 2238)  pantoprazole  (PROTONIX ) EC tablet 40 mg (40 mg Oral Patient Refused/Not Given 11/15/23 2240)  Semaglutide  TABS 7 mg (has no administration in time range)  spironolactone  (ALDACTONE ) tablet 25 mg (25 mg Oral Patient Refused/Not Given 11/15/23 2240)  fentaNYL  (SUBLIMAZE ) injection 50 mcg (50 mcg Intravenous Given 11/15/23 2017)  fentaNYL  (SUBLIMAZE ) injection 50 mcg (50 mcg Intravenous Given 11/15/23 2235)     IMPRESSION / MDM / ASSESSMENT AND PLAN / ED COURSE  I reviewed the triage vital signs and the nursing notes. Patient's presentation is most consistent with acute presentation with potential threat to life or bodily function.  Patient presents with left leg injury as noted above.  On x-ray he has a spiral midshaft femur fracture.  On review of records he has been having possible pathologic lesion in the left femur evaluated by his PCP  I suspect this is the cause of the fracture given no significant trauma.  As needed fentanyl  has been ordered,  labs pending, will consult orthopedic surgery  Discussed with orthopedic surgery Dr. Genelle, he feels given likely related to pathologic lesion needs transfer to tertiary center  [facility] has denied the transfer due to capacity issues, will try [facility] is denied the transfer due to capacity issues  Reached out to Dr. Genelle again he suggests attempting [facility]  ----------------------------------------- 10:50 PM on 11/15/2023 ----------------------------------------- Have not yet heard back from [facility]  ----------------------------------------- 10:55 PM on 11/15/2023 ---------------------------------------- Dr. Genelle was able to get patient accepted to [facility] on the waitlist  Will admit to medicine pending transfer to [facility] for management of pain/ chronic conditions      FINAL CLINICAL IMPRESSION(S) / ED DIAGNOSES   Final diagnoses:  Closed fracture of left femur, unspecified fracture morphology, unspecified portion of femur, initial encounter (HCC)     Rx / DC Orders   ED Discharge Orders     None        Note:  This document was prepared using Dragon voice recognition software and may include unintentional dictation errors.   Carlos Charleston, MD 11/15/23 2308

## 2023-11-15 NOTE — ED Notes (Signed)
called [facility] and spoke with rep Felicia and updated the pt. needs an orthopedic oncologist and she says they are paging it out.

## 2023-11-15 NOTE — Progress Notes (Signed)
 Anticoagulation monitoring(Lovenox ):  57 yo male ordered Lovenox  82.5 mg Q24H    Filed Weights   11/15/23 1710  Weight: (!) 164.7 kg (363 lb)   BMI 45.4    Lab Results  Component Value Date   CREATININE 2.49 (H) 11/15/2023   CREATININE 1.13 10/18/2021   Estimated Creatinine Clearance: 52.7 mL/min (A) (by C-G formula based on SCr of 2.49 mg/dL (H)). Hemoglobin & Hematocrit     Component Value Date/Time   HGB 8.6 (L) 11/15/2023 1820   HCT 25.1 (L) 11/15/2023 1820     Per Protocol for Patient with estCrcl > 30 ml/min and BMI > 30, will transition to Lovenox  82.5 mg Q24h.

## 2023-11-15 NOTE — ED Notes (Signed)
 Called [facility], spoke with rep. Marchelle Folks gave her brief info about the pt faxed demo sheet and power shared all images.

## 2023-11-15 NOTE — ED Triage Notes (Signed)
Patient to ED via [facility] from home for a left knee injury. PT was getting out of bath tub and felt a "pop" in left knee. Denies fall. Unable to bend knee. 20 L forearm- given 150 mcg fentanyl by EMS.

## 2023-11-15 NOTE — ED Notes (Signed)
EDP at bedside  

## 2023-11-16 DIAGNOSIS — I10 Essential (primary) hypertension: Secondary | ICD-10-CM

## 2023-11-16 DIAGNOSIS — N179 Acute kidney failure, unspecified: Secondary | ICD-10-CM

## 2023-11-16 DIAGNOSIS — E1165 Type 2 diabetes mellitus with hyperglycemia: Secondary | ICD-10-CM | POA: Diagnosis not present

## 2023-11-16 DIAGNOSIS — E785 Hyperlipidemia, unspecified: Secondary | ICD-10-CM | POA: Insufficient documentation

## 2023-11-16 DIAGNOSIS — G4733 Obstructive sleep apnea (adult) (pediatric): Secondary | ICD-10-CM

## 2023-11-16 DIAGNOSIS — Z794 Long term (current) use of insulin: Secondary | ICD-10-CM

## 2023-11-16 DIAGNOSIS — S728X2A Other fracture of left femur, initial encounter for closed fracture: Secondary | ICD-10-CM | POA: Diagnosis not present

## 2023-11-16 DIAGNOSIS — K219 Gastro-esophageal reflux disease without esophagitis: Secondary | ICD-10-CM | POA: Insufficient documentation

## 2023-11-16 DIAGNOSIS — E119 Type 2 diabetes mellitus without complications: Secondary | ICD-10-CM

## 2023-11-16 DIAGNOSIS — D649 Anemia, unspecified: Secondary | ICD-10-CM

## 2023-11-16 LAB — CBC
HCT: 22.8 % — ABNORMAL LOW (ref 39.0–52.0)
Hemoglobin: 7.7 g/dL — ABNORMAL LOW (ref 13.0–17.0)
MCH: 32.6 pg (ref 26.0–34.0)
MCHC: 33.8 g/dL (ref 30.0–36.0)
MCV: 96.6 fL (ref 80.0–100.0)
Platelets: 155 10*3/uL (ref 150–400)
RBC: 2.36 MIL/uL — ABNORMAL LOW (ref 4.22–5.81)
RDW: 14 % (ref 11.5–15.5)
WBC: 12.1 10*3/uL — ABNORMAL HIGH (ref 4.0–10.5)
nRBC: 0 % (ref 0.0–0.2)

## 2023-11-16 LAB — RETICULOCYTES
Immature Retic Fract: 15.3 % (ref 2.3–15.9)
RBC.: 2.31 MIL/uL — ABNORMAL LOW (ref 4.22–5.81)
Retic Count, Absolute: 40.2 10*3/uL (ref 19.0–186.0)
Retic Ct Pct: 1.7 % (ref 0.4–3.1)

## 2023-11-16 LAB — BASIC METABOLIC PANEL
Anion gap: 10 (ref 5–15)
BUN: 40 mg/dL — ABNORMAL HIGH (ref 6–20)
CO2: 23 mmol/L (ref 22–32)
Calcium: 10.2 mg/dL (ref 8.9–10.3)
Chloride: 103 mmol/L (ref 98–111)
Creatinine, Ser: 2.35 mg/dL — ABNORMAL HIGH (ref 0.61–1.24)
GFR, Estimated: 31 mL/min — ABNORMAL LOW (ref >60)
Glucose, Bld: 156 mg/dL — ABNORMAL HIGH (ref 70–99)
Potassium: 4.2 mmol/L (ref 3.5–5.1)
Sodium: 136 mmol/L (ref 135–145)

## 2023-11-16 LAB — FERRITIN: Ferritin: 168 ng/mL (ref 24–336)

## 2023-11-16 LAB — IRON AND TIBC
Iron: 42 ug/dL — ABNORMAL LOW (ref 45–182)
Saturation Ratios: 14 % — ABNORMAL LOW (ref 17.9–39.5)
TIBC: 305 ug/dL (ref 250–450)
UIBC: 263 ug/dL

## 2023-11-16 LAB — VITAMIN B12: Vitamin B-12: 1800 pg/mL — ABNORMAL HIGH (ref 180–914)

## 2023-11-16 LAB — GLUCOSE, CAPILLARY
Glucose-Capillary: 172 mg/dL — ABNORMAL HIGH (ref 70–99)
Glucose-Capillary: 157 mg/dL — ABNORMAL HIGH (ref 70–99)

## 2023-11-16 LAB — FOLATE: Folate: 9.5 ng/mL (ref >5.9)

## 2023-11-16 LAB — LACTATE DEHYDROGENASE: LDH: 139 U/L (ref 98–192)

## 2023-11-16 LAB — HIV ANTIBODY (ROUTINE TESTING W REFLEX): HIV Screen 4th Generation wRfx: NONREACTIVE

## 2023-11-16 MED ORDER — INSULIN ASPART 100 UNIT/ML IJ SOLN
0.0000 [IU] | Freq: Three times a day (TID) | INTRAMUSCULAR | Status: DC
  Administered 2023-11-16: 3 [IU] via SUBCUTANEOUS
  Filled 2023-11-16: qty 1

## 2023-11-16 MED ORDER — INSULIN ASPART 100 UNIT/ML IJ SOLN: 0.0000 [IU] | Freq: Every day | INTRAMUSCULAR | Status: DC

## 2023-11-16 MED ORDER — FERROUS SULFATE 325 (65 FE) MG PO TABS: 325.0000 mg | ORAL_TABLET | Freq: Two times a day (BID) | ORAL | Status: DC

## 2023-11-16 MED ORDER — MORPHINE SULFATE (PF) 4 MG/ML IV SOLN
4.0000 mg | INTRAVENOUS | Status: DC | PRN
  Administered 2023-11-16: 4 mg via INTRAVENOUS
  Filled 2023-11-16: qty 1

## 2023-11-16 MED ORDER — ENOXAPARIN SODIUM 100 MG/ML IJ SOSY
0.5000 mg/kg | PREFILLED_SYRINGE | INTRAMUSCULAR | Status: DC
  Administered 2023-11-16: 82.5 mg via SUBCUTANEOUS
  Filled 2023-11-16: qty 1

## 2023-11-16 NOTE — Assessment & Plan Note (Addendum)
-   Continue PPI therapy as well as H2 blocker therapy.

## 2023-11-16 NOTE — Discharge Summary (Addendum)
 Physician Transfer Summary   Patient: Carlos Sanders MRN: [id number] DOB: Dec 27, 1965  Admit date:     11/15/2023  Discharge date: 11/16/23  Discharge Physician: Concepcion Riser   PCP: Jeri Birmingham, MD   Recommendations at discharge:    Transfer to [facility] for bone biopsy and orthopedic surgery.  Discharge Diagnoses: Principal Problem:   Other fracture of left femur, initial encounter for closed fracture (HCC) Active Problems:   AKI (acute kidney injury) (HCC)   Normocytic anemia   Essential hypertension   Type II diabetes mellitus (HCC)   GERD without esophagitis   Dyslipidemia   Obstructive sleep apnea  Resolved Problems:   * No resolved hospital problems. *  Hospital Course: Carlos Sanders is a 58 y.o. male with medical history significant for COPD, CHF, dyslipidemia, type 2 diabetes mellitus, hypertension and obstructive sleep apnea on CPAP, who presented to the emergency room with acute onset of fall and subsequent left knee pain.  He was apparently getting out of the bathtub and he felt his left leg snap.  He has been having left leg pain for about a month for which she has been taking Tylenol .  He was being worked up at [facility] for renal cancer with left thigh mass and was expected to get an MRI in the very near future.  He is also been following with his nephrologist and has an appointment in February.  He denied any fever or chills.  No nausea or vomiting or abdominal pain.  No dysuria, oliguria or hematuria or flank pain.  No headache, dizziness or blurred vision no paresthesias or focal muscle weakness.   Xray left femur- Oblique fracture through the distal femoral diaphysis. Lucency is noted along the medial aspect of the distal femoral metaphysis with increased bony sclerosis surrounding the lesion as well as cortical breakdown consistent with neoplasm till proven otherwise.   Dr. Hilary recommended transfer to tertiary facility for management of this fracture. [facility] accepted him however there were no beds available.  He is admitted here to a medical-surgical bed for further pain management pending transfer to [facility]  Assessment and Plan: * Other fracture of left femur, initial encounter for closed fracture 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 Dr. Genelle was notified about the patient, advised to transfer him to tertiary care center. He his going to a bone biopsy by an ortho oncology team prior to fixation. The patient be admitted med surge bed pending transfer to [facility]. Pain management with IV morphine .  AKI (acute kidney injury) (HCC) I suspect acute on CKD, [facility] labs reviewed shows Creatinine 2 months ago 1.69. Gentle IV fluids given. Avoid nephrotoxins.  Normocytic anemia Anemia workup reviewed iron lower side. Monitor H&H. Give iron supplements. He denies active bleeding.  Essential hypertension Continue amlodipine  and Lopressor   Hold off ARB therapy and diuretic given acute kidney injury  Type II diabetes mellitus (HCC) Continue accucheks, supplemental coverage with NovoLog . Continue his Jardiance  and hold off metformin . Continue his basal coverage.  Obstructive sleep apnea Continue CPAP nightly.  Dyslipidemia On statin therapy as well as Lovaza.  GERD without esophagitis Continue PPI therapy as well as H2 blocker therapy.  Morbid Obesity with BMI 45.37 Diet, exercise and weight reduction.      Consultants: Orthopedics Procedures  performed: none  Disposition:  Transfer to [facility] Diet recommendation:  Discharge Diet Orders (From admission, onward)     Start     Ordered   11/16/23 0000  Diet - low sodium heart healthy        11/16/23 1124   11/16/23 0000  Diet Carb  Modified        11/16/23 1124           Cardiac and Carb modified diet DISCHARGE MEDICATION: Allergies as of 11/16/2023   No Known Allergies      Home Medication List     TAKE these medications    amLODipine  10 MG tablet Commonly known as: NORVASC  Take 1 tablet by mouth daily.   atorvastatin  80 MG tablet Commonly known as: LIPITOR  Take 1 tablet by mouth daily.   famotidine  20 MG tablet Commonly known as: PEPCID  Take 20 mg by mouth daily.   furosemide  20 MG tablet Commonly known as: LASIX  Take 20 mg by mouth daily.   Insulin  Aspart FlexPen 100 UNIT/ML Commonly known as: NOVOLOG  Inject 15 Units into the skin 3 (three) times daily.   Jardiance  10 MG Tabs tablet Generic drug: empagliflozin  Take 10 mg by mouth daily.   losartan  100 MG tablet Commonly known as: COZAAR  Take 1 tablet by mouth daily.   metFORMIN  500 MG 24 hr tablet Commonly known as: GLUCOPHAGE -XR Take 500 mg by mouth daily with breakfast.   metoprolol  tartrate 50 MG tablet Commonly known as: LOPRESSOR  Take 1 tablet by mouth 2 (two) times daily.   pantoprazole  40 MG tablet Commonly known as: PROTONIX  Take 40 mg by mouth daily.   Semaglutide  7 MG Tabs Take 7 mg by mouth daily.   spironolactone  25 MG tablet Commonly known as: ALDACTONE  Take 1 tablet by mouth daily.        Current Facility-Administered Medications:    0.9 %  sodium chloride  infusion, , Intravenous, Continuous, Mansy, Jan A, MD, Last Rate: 75 mL/hr at 11/16/23 0950, Infusion Verify at 11/16/23 0950   acetaminophen  (TYLENOL ) tablet 650 mg, 650 mg, Oral, Q6H PRN, 650 mg at 11/16/23 0508 **OR** acetaminophen  (TYLENOL ) suppository 650 mg, 650 mg, Rectal, Q6H PRN, Mansy, Jan A, MD   amLODipine  (NORVASC ) tablet 10 mg, 10 mg, Oral, Daily, Kinner, Robert, MD, 10 mg at 11/16/23 9094   atorvastatin  (LIPITOR ) tablet 80 mg, 80 mg, Oral, Daily, Kinner, Robert, MD, 80 mg at 11/16/23 9095   empagliflozin  (JARDIANCE ) tablet 10 mg, 10 mg,  Oral, Daily, Kinner, Robert, MD, 10 mg at 11/16/23 9094   enoxaparin  (LOVENOX ) injection 82.5 mg, 0.5 mg/kg, Subcutaneous, Q24H, Mansy, Jan A, MD, 82.5 mg at 11/16/23 0120   famotidine  (PEPCID ) tablet 20 mg, 20 mg, Oral, Daily, Kinner, Robert, MD, 20 mg at 11/16/23 9094   ferrous sulfate  tablet 325 mg, 325 mg, Oral, BID WC, Aeva Posey, MD   insulin  aspart (novoLOG ) injection 0-15 Units, 0-15 Units, Subcutaneous, TID WC, Mansy, Jan A, MD, 3 Units at 11/16/23 9094   insulin  aspart (novoLOG ) injection 0-5 Units, 0-5 Units, Subcutaneous, QHS, Mansy, Jan A, MD   magnesium  hydroxide (MILK OF MAGNESIA) suspension 30 mL, 30 mL, Oral, Daily PRN, Mansy, Jan A, MD   metoprolol  tartrate (LOPRESSOR ) tablet 50 mg, 50 mg, Oral, BID, Kinner, Robert, MD, 50 mg at 11/16/23 0902   morphine  (PF) 4 MG/ML injection 4 mg, 4 mg, Intravenous, Q3H PRN, Dhara Schepp, MD, 4 mg at 11/16/23 1106   ondansetron  (ZOFRAN ) tablet 4 mg, 4 mg, Oral, Q6H  PRN **OR** ondansetron  (ZOFRAN ) injection 4 mg, 4 mg, Intravenous, Q6H PRN, Mansy, Jan A, MD   pantoprazole  (PROTONIX ) EC tablet 40 mg, 40 mg, Oral, Daily, Kinner, Robert, MD, 40 mg at 11/16/23 9095   traZODone  (DESYREL ) tablet 50 mg, 50 mg, Oral, QHS PRN, Mansy, Madison LABOR, MD    Discharge Exam: Filed Weights   11/15/23 1710  Weight: (!) 164.7 kg   General -middle-aged morbidly obese African-American male, status due to pain HEENT - PERRLA, EOMI, atraumatic head, non tender sinuses. Lung - Clear, rales, rhonchi, wheezes. Heart - S1, S2 heard, no murmurs, rubs, trace pedal edema. Abdomen-soft, obese, bowel sounds good Neuro - Alert, awake and oriented x 3, non focal exam. Skin - Warm and dry.  Left leg immobilizer noted  Condition at discharge: stable  The results of significant diagnostics from this hospitalization (including imaging, microbiology, ancillary and laboratory) are listed below for reference.   Imaging Studies: DG Femur Min 2 Views  Left Result Date: 11/15/2023 CLINICAL DATA:  Popping sensation while exiting bathtub with pain and deformity, initial encounter EXAM: LEFT FEMUR 2 VIEWS COMPARISON:  None Available. FINDINGS: Oblique fracture is noted through the distal diaphysis of the left femur. Angulation at the fracture site is noted. Just beyond the fracture site in the distal femur, there is a lucency that measures approximately 2.8 cm in greatest dimension along the medial distal metaphyseal cortex. Cortical bone loss is noted as well. No soft tissue abnormality is seen. IMPRESSION: Oblique fracture through the distal femoral diaphysis. Lucency is noted along the medial aspect of the distal femoral metaphysis with increased bony sclerosis surrounding the lesion as well as cortical breakdown consistent with neoplasm till proven otherwise. Further workup is recommended. Electronically Signed   By: Oneil Devonshire M.D.   On: 11/15/2023 20:19    Microbiology: Results for orders placed or performed during the hospital encounter of 10/18/21  Resp Panel by RT-PCR (Flu A&B, Covid) Nasopharyngeal Swab     Status: None   Collection Time: 10/18/21  8:48 PM   Specimen: Nasopharyngeal Swab; Nasopharyngeal(NP) swabs in vial transport medium  Result Value Ref Range Status   SARS Coronavirus 2 by RT PCR NEGATIVE NEGATIVE Final    Comment: (NOTE) SARS-CoV-2 target nucleic acids are NOT DETECTED.  The SARS-CoV-2 RNA is generally detectable in upper respiratory specimens during the acute phase of infection. The lowest concentration of SARS-CoV-2 viral copies this assay can detect is 138 copies/mL. A negative result does not preclude SARS-Cov-2 infection and should not be used as the sole basis for treatment or other patient management decisions. A negative result may occur with  improper specimen collection/handling, submission of specimen other than nasopharyngeal swab, presence of viral mutation(s) within the areas targeted by this assay,  and inadequate number of viral copies(<138 copies/mL). A negative result must be combined with clinical observations, patient history, and epidemiological information. The expected result is Negative.  Fact Sheet for Patients:  bloggercourse.com  Fact Sheet for Healthcare Providers:  seriousbroker.it  This test is no t yet approved or cleared by the [place]  FDA and  has been authorized for detection and/or diagnosis of SARS-CoV-2 by FDA under an Emergency Use Authorization (EUA). This EUA will remain  in effect (meaning this test can be used) for the duration of the COVID-19 declaration under Section 564(b)(1) of the Act, 21 U.S.C.section 360bbb-3(b)(1), unless the authorization is terminated  or revoked sooner.       Influenza A by PCR NEGATIVE NEGATIVE Final  Influenza B by PCR NEGATIVE NEGATIVE Final    Comment: (NOTE) The Xpert Xpress SARS-CoV-2/FLU/RSV plus assay is intended as an aid in the diagnosis of influenza from Nasopharyngeal swab specimens and should not be used as a sole basis for treatment. Nasal washings and aspirates are unacceptable for Xpert Xpress SARS-CoV-2/FLU/RSV testing.  Fact Sheet for Patients: bloggercourse.com  Fact Sheet for Healthcare Providers: seriousbroker.it  This test is not yet approved or cleared by the [place]  FDA and has been authorized for detection and/or diagnosis of SARS-CoV-2 by FDA under an Emergency Use Authorization (EUA). This EUA will remain in effect (meaning this test can be used) for the duration of the COVID-19 declaration under Section 564(b)(1) of the Act, 21 U.S.C. section 360bbb-3(b)(1), unless the authorization is terminated or revoked.  Performed at [facility], [address] [place], [place] [address]     Labs: CBC: Recent Labs  Lab 11/15/23 1820 11/16/23 0409  WBC 10.0  12.1*  HGB 8.6* 7.7*  HCT 25.1* 22.8*  MCV 97.7 96.6  PLT 158 155   Basic Metabolic Panel: Recent Labs  Lab 11/15/23 1820 11/16/23 0409  NA 137 136  K 4.0 4.2  CL 102 103  CO2 23 23  GLUCOSE 201* 156*  BUN 45* 40*  CREATININE 2.49* 2.35*  CALCIUM  10.2 10.2   Liver Function Tests: Recent Labs  Lab 11/15/23 1820  AST 26  ALT 54*  ALKPHOS 93  BILITOT 0.5  PROT 6.7  ALBUMIN 3.6   CBG: Recent Labs  Lab 11/16/23 0116 11/16/23 0746  GLUCAP 172* 157*    Discharge time spent: 39 minutes.  Signed: Concepcion Riser, MD [facility] 11/16/2023

## 2023-11-16 NOTE — Plan of Care (Signed)

## 2023-11-16 NOTE — Assessment & Plan Note (Addendum)
-   I suspect prerenal etiology. - The patient will be placed on hydration with IV normal saline will follow BMP. - We will avoid nephrotoxins.

## 2023-11-16 NOTE — Assessment & Plan Note (Addendum)
-   We will continue amlodipine and Lopressor while holding off ARB therapy and diuretic given acute kidney injury

## 2023-11-16 NOTE — Assessment & Plan Note (Signed)
-   The patient will be placed on supplemental coverage with NovoLog. - Will continue his Jardiance and hold off metformin. - We will continue his basal coverage.

## 2023-11-16 NOTE — Assessment & Plan Note (Signed)
-   Will obtain anemia workup. - Will monitor H&H.

## 2023-11-16 NOTE — Progress Notes (Signed)
 [facility] was called and  report was given to [facility], RN,transported by EMS, no distress noted when leaving the floor.

## 2023-11-16 NOTE — H&P (Addendum)
 [place]   PATIENT NAME: Carlos Sanders    MR#:  [id number]  DATE OF BIRTH:  August 28, 1966  DATE OF ADMISSION:  11/15/2023  PRIMARY CARE PHYSICIAN: Jeri Birmingham, MD   Patient is coming from: Home  REQUESTING/REFERRING PHYSICIAN: Cyrena Mylar, MD  CHIEF COMPLAINT:   Chief Complaint  Patient presents with  . Knee Injury    HISTORY OF PRESENT ILLNESS:  Carlos Sanders is a 58 y.o. male with medical history significant for COPD, CHF, dyslipidemia, type 2 diabetes mellitus, hypertension and obstructive sleep apnea on CPAP, who presented to the emergency room with acute onset of fall and subsequent left knee pain.  He was apparently getting out of the bathtub and he felt his left leg snap.  He has been having left leg pain for about a month for which she has been taking Tylenol .  He was being worked up at [facility] for renal cancer with left thigh mass and was expected to get an MRI in the very near future.  He is also been following with his nephrologist and has an appointment in February.  He denied any fever or chills.  No nausea or vomiting or abdominal pain.  No dysuria, oliguria or hematuria or flank pain.  No headache, dizziness or blurred vision no paresthesias or focal muscle weakness.  ED Course: When the patient came to the ER, BP was 145/68 with otherwise normal vital signs.  Labs revealed  a blood glucose of 201 BUN of 45 with a creatinine of 2.49 that were previously normal.  Hemoglobin was 8.6 and hematocrit 25.1 compared to 12.8 and 37.21 on 06/02/2023.  Blood group was O+ with negative antibody screen.  Coag profile was within normal. EKG as reviewed by me : None Imaging: Left femur x-ray showed the following: Oblique fracture through the distal femoral diaphysis.   Lucency is noted along the medial aspect of the distal femoral metaphysis with increased bony sclerosis surrounding the lesion as well as cortical breakdown consistent with neoplasm till proven otherwise.  Further workup is recommended.   The patient was given a total of 150 mcg of IV fentanyl , 20 mg of p.o. Pepcid  and was placed on hydration with IV normal saline at 75 mL/h.  Dr. Hilary was notified about the patient and recommended transfer to tertiary facility for management of this fracture.  Contact was made to [place], [facility], [facility] and [place] and the patient was accepted at [facility] however there were no beds available.  He will be admitted here to a medical-surgical bed for further pain management pending transfer PAST MEDICAL HISTORY:   Past Medical History:  Diagnosis Date  . CHF (congestive heart failure) (HCC)   . COPD (chronic obstructive pulmonary disease) (HCC)   . Diabetes mellitus without complication (HCC)   . History of high cholesterol   . Hypertension   . Sleep apnea     PAST SURGICAL HISTORY:   Past Surgical History:  Procedure Laterality Date  . ANGIOPLASTY    . left shoulder surgery    . NASAL SINUS SURGERY      SOCIAL HISTORY:   Social History   Tobacco Use  . Smoking status: Former    Types: Cigarettes  . Smokeless tobacco: Never  Substance Use Topics  . Alcohol use: Never    FAMILY HISTORY:  History reviewed. No pertinent family history. No pertinent familial diseases. DRUG ALLERGIES:  No Known Allergies  REVIEW OF SYSTEMS:   ROS  As per history of present illness. All pertinent systems were reviewed above. Constitutional, HEENT, cardiovascular, respiratory, GI, GU, musculoskeletal, neuro, psychiatric, endocrine, integumentary and hematologic systems were reviewed and are otherwise negative/unremarkable except for positive findings mentioned above in the HPI.   MEDICATIONS AT HOME:   Prior to Admission medications   Medication Sig Start Date End Date Taking? Authorizing Provider  amLODipine  (NORVASC ) 10 MG tablet Take 1 tablet by mouth daily. 05/24/23  Yes [provider]  atorvastatin  (LIPITOR ) 80 MG tablet Take 1  tablet by mouth daily. 08/18/23  Yes [provider]  famotidine  (PEPCID ) 20 MG tablet Take 20 mg by mouth daily. 09/12/23 09/11/24 Yes [provider]  furosemide  (LASIX ) 20 MG tablet Take 20 mg by mouth daily. 11/09/22  Yes [provider]  Insulin  Aspart FlexPen (NOVOLOG ) 100 UNIT/ML Inject 15 Units into the skin 3 (three) times daily. 07/14/23 07/08/24 Yes [provider]  JARDIANCE  10 MG TABS tablet Take 10 mg by mouth daily. 05/02/23  Yes [provider]  losartan  (COZAAR ) 100 MG tablet Take 1 tablet by mouth daily. 06/06/23  Yes [provider]  metFORMIN  (GLUCOPHAGE -XR) 500 MG 24 hr tablet Take 500 mg by mouth daily with breakfast. 01/10/23  Yes [provider]  metoprolol  tartrate (LOPRESSOR ) 50 MG tablet Take 1 tablet by mouth 2 (two) times daily. 05/24/23  Yes [provider]  pantoprazole  (PROTONIX ) 40 MG tablet Take 40 mg by mouth daily. 02/08/23  Yes [provider]  Semaglutide  7 MG TABS Take 7 mg by mouth daily. 08/04/23 08/03/24 Yes [provider]  spironolactone  (ALDACTONE ) 25 MG tablet Take 1 tablet by mouth daily. 06/20/23  Yes [provider]      VITAL SIGNS:  Blood pressure 121/69, pulse 96, temperature 98.4 F (36.9 C), resp. rate 20, height 6' 3 (1.905 m), weight (!) 164.7 kg, SpO2 100%.  PHYSICAL EXAMINATION:  Physical Exam  GENERAL:  58 y.o.-year-old male patient lying in the bed with no acute distress.  EYES: Pupils equal, round, reactive to light and accommodation. No scleral icterus. Extraocular muscles intact.  HEENT: Head atraumatic, normocephalic. Oropharynx and nasopharynx clear.  NECK:  Supple, no jugular venous distention. No thyroid enlargement, no tenderness.  LUNGS: Normal breath sounds bilaterally, no wheezing, rales,rhonchi or crepitation. No use of accessory muscles of respiration.  CARDIOVASCULAR: Regular rate and rhythm, S1, S2 normal. No murmurs, rubs, or  gallops.  ABDOMEN: Soft, nondistended, nontender. Bowel sounds present. No organomegaly or mass.  EXTREMITIES: No pedal edema, cyanosis, or clubbing. Musculoskeletal: He has a left knee brace with underlying swelling and tenderness.SABRA NEUROLOGIC: Cranial nerves II through XII are intact. Muscle strength 5/5 in all extremities. Sensation intact. Gait not checked.  PSYCHIATRIC: The patient is alert and oriented x 3.  Normal affect and good eye contact. SKIN: No obvious rash, lesion, or ulcer.   LABORATORY PANEL:   CBC Recent Labs  Lab 11/15/23 1820  WBC 10.0  HGB 8.6*  HCT 25.1*  PLT 158   ------------------------------------------------------------------------------------------------------------------  Chemistries  Recent Labs  Lab 11/15/23 1820  NA 137  K 4.0  CL 102  CO2 23  GLUCOSE 201*  BUN 45*  CREATININE 2.49*  CALCIUM  10.2  AST 26  ALT 54*  ALKPHOS 93  BILITOT 0.5   ------------------------------------------------------------------------------------------------------------------  Cardiac Enzymes No results for input(s): TROPONINI in the last 168 hours. ------------------------------------------------------------------------------------------------------------------  RADIOLOGY:  DG Femur Min 2 Views Left Result Date: 11/15/2023 CLINICAL DATA:  Popping sensation while exiting  bathtub with pain and deformity, initial encounter EXAM: LEFT FEMUR 2 VIEWS COMPARISON:  None Available. FINDINGS: Oblique fracture is noted through the distal diaphysis of the left femur. Angulation at the fracture site is noted. Just beyond the fracture site in the distal femur, there is a lucency that measures approximately 2.8 cm in greatest dimension along the medial distal metaphyseal cortex. Cortical bone loss is noted as well. No soft tissue abnormality is seen. IMPRESSION: Oblique fracture through the distal femoral diaphysis. Lucency is noted along the medial aspect of the distal  femoral metaphysis with increased bony sclerosis surrounding the lesion as well as cortical breakdown consistent with neoplasm till proven otherwise. Further workup is recommended. Electronically Signed   By: Oneil Devonshire M.D.   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 - The patient be admitted med surge bed pending transfer to [facility]. - Pain management will be provided. - Orthopedic consult will be obtained. - Dr. Genelle was notified about the patient.  AKI (acute kidney injury) (HCC) - I suspect prerenal etiology. - The patient will be placed on hydration with IV normal saline will follow BMP. - We will avoid nephrotoxins.  Normocytic anemia - Will obtain anemia workup. - Will monitor H&H.  Essential hypertension - We will continue amlodipine  and Lopressor  while holding off ARB therapy and diuretic given acute kidney injury  Type II diabetes mellitus (HCC) - The patient will be placed on supplemental coverage with NovoLog . - Will continue his Jardiance  and hold off metformin . - We will continue his basal coverage.  Obstructive sleep apnea - We will continue his CPAP nightly.  Dyslipidemia --We will continue statin therapy as well as Lovaza.  GERD without esophagitis - Continue PPI therapy as well as H2 blocker therapy.    DVT prophylaxis: Lovenox   for now given potential malignancy while monitoring his H&H. Advanced Care Planning:  Code Status: full code.  Family Communication:  The plan of care was discussed in details with the patient (and family). I answered all questions. The patient  agreed to proceed with the above mentioned plan. Further management will depend upon hospital course. Disposition Plan: Back to previous home environment Consults called: none.  All the records are reviewed and case discussed with ED provider.  Status is: Inpatient  At the time of the admission, it appears that the appropriate admission status for this patient is inpatient.  This is judged to be reasonable and necessary in order to provide the required intensity of service to ensure the patient's safety given the presenting symptoms, physical exam findings and initial radiographic and laboratory data in the context of comorbid conditions.  The patient requires inpatient status due to high intensity of service, high risk of further deterioration and high frequency of surveillance required.  I certify that at the time of admission, it is my clinical judgment that the patient will require inpatient hospital care extending more than 2 midnights.                            Dispo: The patient is from: Home  Anticipated d/c is to: Home              Patient currently is not medically stable to d/c.              Difficult to place patient: No  Madison DELENA Peaches M.D on 11/16/2023 at 12:53 AM  [facility]   From 7 PM-7 AM, contact night-coverage [email or web address]  CC: Primary care physician; Zeitler, Evan, MD

## 2023-11-16 NOTE — Assessment & Plan Note (Addendum)
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 - The patient be admitted med surge bed pending transfer to [facility]. - Pain management will be provided. - Orthopedic consult will be obtained. - Dr. Genelle was notified about the patient.

## 2023-11-16 NOTE — Plan of Care (Signed)
  Problem: Education: Goal: Knowledge of General Education information will improve Description: Including pain rating scale, medication(s)/side effects and non-pharmacologic comfort measures Outcome: Progressing   Problem: Nutrition: Goal: Adequate nutrition will be maintained Outcome: Progressing   Problem: Coping: Goal: Level of anxiety will decrease Outcome: Progressing   Problem: Pain Management: Goal: General experience of comfort will improve Outcome: Progressing   Problem: Metabolic: Goal: Ability to maintain appropriate glucose levels will improve Outcome: Progressing

## 2023-11-16 NOTE — Assessment & Plan Note (Signed)
-   We will continue statin therapy as well as Lovaza. 

## 2023-11-16 NOTE — Plan of Care (Signed)
 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  Unfortunately he did have a fall out of his bathtub which produced a fracture through 1 of these lytic type lesions.  Given the fact that he does not officially have a known primary I do ultimately believe that biopsy will be required of the lesion prior to fixation to avoid seeding any type of tumor throughout the femur.  At this time he is comfortable and placed in a knee immobilizer for immobilization.  At this time I have spoken with [facility] and would recommend transfer for orthopedic oncology assessment.  He is accepted pending bed availability

## 2023-11-16 NOTE — Assessment & Plan Note (Signed)
-   We will continue his CPAP nightly.

## 2023-11-17 LAB — HEMOGLOBIN A1C
Hgb A1c MFr Bld: 5.8 % — ABNORMAL HIGH (ref 4.8–5.6)
Mean Plasma Glucose: 120 mg/dL

## 2024-07-16 DEATH — deceased
